# Patient Record
Sex: Male | Born: 1965 | Race: White | Hispanic: No | Marital: Single | Smoking: Current every day smoker
Health system: Southern US, Community
[De-identification: ages and names within clinical notes are randomized; demographics above are authoritative.]

## PROBLEM LIST (undated history)

## (undated) DIAGNOSIS — F17200 Nicotine dependence, unspecified, uncomplicated: Secondary | ICD-10-CM

## (undated) DIAGNOSIS — M259 Joint disorder, unspecified: Secondary | ICD-10-CM

## (undated) HISTORY — PX: KNEE ARTHROSCOPY W/ ACL RECONSTRUCTION: SHX1858

## (undated) HISTORY — PX: KNEE CARTILAGE SURGERY: SHX688

## (undated) HISTORY — PX: ROTATOR CUFF REPAIR: SHX139

## (undated) HISTORY — PX: INCISION AND DRAINAGE PERIRECTAL ABSCESS: SHX1804

---

## 2014-06-29 ENCOUNTER — Encounter (HOSPITAL_COMMUNITY): Payer: Self-pay | Admitting: Emergency Medicine

## 2014-06-29 ENCOUNTER — Emergency Department (HOSPITAL_COMMUNITY)
Admission: EM | Admit: 2014-06-29 | Discharge: 2014-06-29 | Disposition: A | Discharge status: Home / Self Care | Payer: Medicaid Other | Attending: Emergency Medicine | Admitting: Emergency Medicine

## 2014-06-29 ENCOUNTER — Emergency Department (HOSPITAL_COMMUNITY): Payer: Medicaid Other

## 2014-06-29 DIAGNOSIS — R5383 Other fatigue: Secondary | ICD-10-CM

## 2014-06-29 DIAGNOSIS — K612 Anorectal abscess: Secondary | ICD-10-CM | POA: Diagnosis not present

## 2014-06-29 DIAGNOSIS — K6289 Other specified diseases of anus and rectum: Secondary | ICD-10-CM | POA: Diagnosis present

## 2014-06-29 DIAGNOSIS — R000 Tachycardia, unspecified: Secondary | ICD-10-CM | POA: Diagnosis not present

## 2014-06-29 DIAGNOSIS — Z792 Long term (current) use of antibiotics: Secondary | ICD-10-CM | POA: Diagnosis not present

## 2014-06-29 DIAGNOSIS — K611 Rectal abscess: Secondary | ICD-10-CM

## 2014-06-29 DIAGNOSIS — F17200 Nicotine dependence, unspecified, uncomplicated: Secondary | ICD-10-CM | POA: Insufficient documentation

## 2014-06-29 DIAGNOSIS — R5381 Other malaise: Secondary | ICD-10-CM | POA: Diagnosis not present

## 2014-06-29 DIAGNOSIS — K610 Anal abscess: Secondary | ICD-10-CM

## 2014-06-29 DIAGNOSIS — R509 Fever, unspecified: Secondary | ICD-10-CM | POA: Insufficient documentation

## 2014-06-29 LAB — URINALYSIS, ROUTINE W REFLEX MICROSCOPIC
Bilirubin Urine: NEGATIVE
GLUCOSE, UA: NEGATIVE mg/dL
HGB URINE DIPSTICK: NEGATIVE
Ketones, ur: 15 mg/dL — AB
Leukocytes, UA: NEGATIVE
Nitrite: NEGATIVE
PROTEIN: NEGATIVE mg/dL
Specific Gravity, Urine: 1.014 (ref 1.005–1.030)
Urobilinogen, UA: 1 mg/dL (ref 0.0–1.0)
pH: 6 (ref 5.0–8.0)

## 2014-06-29 MED ORDER — HYDROMORPHONE HCL PF 1 MG/ML IJ SOLN
1.0000 mg | Freq: Once | INTRAMUSCULAR | Status: DC
  Filled 2014-06-29: qty 1

## 2014-06-29 MED ORDER — OXYCODONE-ACETAMINOPHEN 5-325 MG PO TABS: 1.0000 | ORAL_TABLET | ORAL | Status: DC | PRN

## 2014-06-29 MED ORDER — SODIUM CHLORIDE 0.9 % IV SOLN: INTRAVENOUS | Status: DC

## 2014-06-29 MED ORDER — AMOXICILLIN-POT CLAVULANATE 875-125 MG PO TABS: 1.0000 | ORAL_TABLET | Freq: Two times a day (BID) | ORAL | Status: AC

## 2014-06-29 MED ORDER — LIDOCAINE HCL 1 % IJ SOLN
INTRAMUSCULAR | Status: AC
  Filled 2014-06-29: qty 20

## 2014-06-29 MED ORDER — LIDOCAINE-EPINEPHRINE (PF) 1 %-1:200000 IJ SOLN
INTRAMUSCULAR | Status: AC
  Filled 2014-06-29: qty 10

## 2014-06-29 MED ORDER — ONDANSETRON HCL 4 MG/2ML IJ SOLN
4.0000 mg | Freq: Once | INTRAMUSCULAR | Status: DC
  Filled 2014-06-29: qty 2

## 2014-06-29 NOTE — Discharge Instructions (Signed)

## 2014-06-29 NOTE — ED Notes (Signed)
Pt reports he is here because of a peri-rectal abcess,  States he has had one before and knows because it feels the same.  Pt reports tried sitz baths and takes percocet but not stool softener .  Sts not impacted and the pain is 5/10 when lying, 8/10 when sitting.  States 10/10 went standing.  Pt calm and affable.

## 2014-06-29 NOTE — ED Notes (Signed)
Forgot to have pt sign for discharge

## 2014-06-29 NOTE — Consult Note (Signed)
Reason for Consult perianal abscess Referring Physician: Dr Vivi Martens MD  Louis Mcconnell is an 48 y.o. male.  HPI:   Patient presents to the emergency room with a five-day history of perianal pain, perianal swelling, and fever today. Patient has history of perirectal abscess drained in 2 years ago. He is trying to medicate this at home with no relief. He has tried topical creams and soaking in warm tub water. He continues to have perianal pain and swelling. Denies any drainage. No change in bowel function. He has had some difficulty passing his urine.  He was able to void with  Me in the room.  History reviewed. No pertinent past medical history.  Past Surgical History  Procedure Laterality Date  . Knee cartilage surgery    . Knee arthroscopy w/ acl reconstruction    . Incision and drainage perirectal abscess      No family history on file.  Social History:  reports that he has been smoking.  He does not have any smokeless tobacco history on file. He reports that he drinks alcohol. He reports that he does not use illicit drugs.  Allergies: No Known Allergies  Medications: I have reviewed the patient's current medications.  Results for orders placed during the hospital encounter of 06/29/14 (from the past 48 hour(s))  URINALYSIS, ROUTINE W REFLEX MICROSCOPIC     Status: Abnormal   Collection Time    06/29/14  7:54 PM      Result Value Ref Range   Color, Urine YELLOW  YELLOW   APPearance CLEAR  CLEAR   Specific Gravity, Urine 1.014  1.005 - 1.030   pH 6.0  5.0 - 8.0   Glucose, UA NEGATIVE  NEGATIVE mg/dL   Hgb urine dipstick NEGATIVE  NEGATIVE   Bilirubin Urine NEGATIVE  NEGATIVE   Ketones, ur 15 (*) NEGATIVE mg/dL   Protein, ur NEGATIVE  NEGATIVE mg/dL   Urobilinogen, UA 1.0  0.0 - 1.0 mg/dL   Nitrite NEGATIVE  NEGATIVE   Leukocytes, UA NEGATIVE  NEGATIVE   Comment: MICROSCOPIC NOT DONE ON URINES WITH NEGATIVE PROTEIN, BLOOD, LEUKOCYTES, NITRITE, OR GLUCOSE <1000 mg/dL.    No  results found.  Review of Systems  Constitutional: Positive for fever, chills and malaise/fatigue.  HENT: Negative.   Eyes: Negative.   Cardiovascular: Negative.   Gastrointestinal: Negative.   Genitourinary: Negative.   Musculoskeletal: Negative.   Skin: Negative.   Neurological: Negative.   Endo/Heme/Allergies: Negative.    Blood pressure 151/87, pulse 113, temperature 100.4 F (38 C), temperature source Oral, resp. rate 17, SpO2 98.00%. Physical Exam  Constitutional: He is oriented to person, place, and time. He appears well-developed and well-nourished.  HENT:  Head: Normocephalic and atraumatic.  Eyes: EOM are normal. Pupils are equal, round, and reactive to light.  Neck: Normal range of motion. Neck supple.  Respiratory: Effort normal.  GI: Soft. Bowel sounds are normal.  Genitourinary: Rectal exam shows mass and tenderness. Rectal exam shows no internal hemorrhoid.     Musculoskeletal: Normal range of motion.  Neurological: He is alert and oriented to person, place, and time.  Skin: Skin is warm and dry.  Psychiatric: He has a normal mood and affect. His behavior is normal. Judgment and thought content normal.    Assessment/Plan: Superficial perianal abscess  Recommend drainage in the emergency room. Discussed the procedure with the patient as well as risk of potential complications. Risk of bleeding, infection, recurrence, injury to neighboring structures, and the need for other  operations. He would like to proceed with bedside drainage of his perianal abscess.  Procedure: Patient placed on  Left side. Perianal region over abscess prepped in sterile fashion. 10 cc of  2% lidocaine used and infiltrated around the 2 cm abscess. Incision made and pus expressed from the area. Packed with quarter-inch iodoform packing. Dry dressing applied. Patient tolerated the procedure well.  Discharge instructions: Patient will be discharged on Augmentin 875 mg by mouth twice a day.  Prescription given for Percocet 1-2 tablets every 4 hours as needed. OK to shower. Return to clinic on Tuesday to urgent office area rechecked. Call with any increasing fever, pain, nausea, vomiting, or other issue of concern.  Maanya Hippert A. 06/29/2014, 9:08 PM

## 2014-06-29 NOTE — ED Provider Notes (Addendum)
CSN: [id number]     Arrival date & time 06/29/14  1846 History   First MD Initiated Contact with Patient 06/29/14 1922     Chief Complaint  Patient presents with  . Rectal Pain     (Consider location/radiation/quality/duration/timing/severity/associated sxs/prior Treatment) HPI Comments: Patient here complaining of rectal pain x4 days. History of perirectal abscess and this is similar. He's also had a fever at home after 102. He has had nausea and body aches as well as some blood in stool. Has noted fullness around his anus. Denies abdominal pain. Has been using sitz baths and suppositories without relief. He is also to use stool softeners. Symptoms worse with defecating in movement and characterized as sharp pain. Nothing makes her symptoms better.   History reviewed. No pertinent past medical history. Past Surgical History  Procedure Laterality Date  . Knee cartilage surgery    . Knee arthroscopy w/ acl reconstruction    . Incision and drainage perirectal abscess     No family history on file. History  Substance Use Topics  . Smoking status: Current Every Day Smoker  . Smokeless tobacco: Not on file  . Alcohol Use: Yes     Comment: socially    Review of Systems  All other systems reviewed and are negative.     Allergies  Review of patient's allergies indicates no known allergies.  Home Medications   Prior to Admission medications   Medication Sig Start Date End Date Taking? Authorizing Provider  doxycycline (VIBRA-TABS) 100 MG tablet Take 100 mg by mouth 2 (two) times daily.   Yes Historical Provider, MD  oxyCODONE-acetaminophen (PERCOCET/ROXICET) 5-325 MG per tablet Take 1 tablet by mouth every 4 (four) hours as needed for moderate pain or severe pain.   Yes Historical Provider, MD  Pseudoeph-Doxylamine-DM-APAP (NYQUIL D COLD/FLU PO) Take 2 capsules by mouth once as needed (sinuses.).   Yes Historical Provider, MD  Tetrahydrozoline HCl (OPTI-CLEAR OP) Apply 1-2 drops  to eye daily as needed (dry eyes).   Yes Historical Provider, MD   BP 151/87  Pulse 113  Temp(Src) 100.4 F (38 C) (Oral)  Resp 17  SpO2 98% Physical Exam  Nursing note and vitals reviewed. Constitutional: He is oriented to person, place, and time. He appears well-developed and well-nourished.  Non-toxic appearance. No distress.  HENT:  Head: Normocephalic and atraumatic.  Eyes: Conjunctivae, EOM and lids are normal. Pupils are equal, round, and reactive to light.  Neck: Normal range of motion. Neck supple. No tracheal deviation present. No mass present.  Cardiovascular: Regular rhythm and normal heart sounds.  Tachycardia present.  Exam reveals no gallop.   No murmur heard. Pulmonary/Chest: Effort normal and breath sounds normal. No stridor. No respiratory distress. He has no decreased breath sounds. He has no wheezes. He has no rhonchi. He has no rales.  Abdominal: Soft. Normal appearance and bowel sounds are normal. He exhibits no distension. There is no tenderness. There is no rebound and no CVA tenderness.  Genitourinary:     Musculoskeletal: Normal range of motion. He exhibits no edema and no tenderness.  Neurological: He is alert and oriented to person, place, and time. He has normal strength. No cranial nerve deficit or sensory deficit. GCS eye subscore is 4. GCS verbal subscore is 5. GCS motor subscore is 6.  Skin: Skin is warm and dry. No abrasion and no rash noted.  Psychiatric: He has a normal mood and affect. His speech is normal and behavior is normal.  ED Course  Procedures (including critical care time) Labs Review Labs Reviewed  URINE CULTURE  CBC WITH DIFFERENTIAL  BASIC METABOLIC PANEL  URINALYSIS, ROUTINE W REFLEX MICROSCOPIC    Imaging Review No results found.   EKG Interpretation None      MDM   Final diagnoses:  None    Spoke with dr Brantley Stage from general surgery, will come to see patient  9:28 PM Pt seen by dr Lorraine Lax and abscess  drained, f/u instructions given    Leota Jacobsen, MD 06/29/14 1953  Leota Jacobsen, MD 06/29/14 2128

## 2014-06-29 NOTE — ED Notes (Signed)
Pt reports perirectal abcess since Tuesday, progressively worsening. Hx of same that required surgery. Sts no BM since Wednesday, has tried suppositories with no relief. Sts he does not feel impacted. +nausea, body aches, feeling "full"

## 2014-07-01 ENCOUNTER — Telehealth (HOSPITAL_BASED_OUTPATIENT_CLINIC_OR_DEPARTMENT_OTHER): Payer: Self-pay | Admitting: Emergency Medicine

## 2014-07-01 LAB — URINE CULTURE
COLONY COUNT: NO GROWTH
Culture: NO GROWTH

## 2014-07-01 NOTE — Progress Notes (Signed)
  CARE MANAGEMENT ED NOTE 07/01/2014  Patient:  Louis Mcconnell,Louis Mcconnell   Account Number:  [id number]  Date Initiated:  07/01/2014  Documentation initiated by:  Jackelyn Poling  Subjective/Objective Assessment:   48 yr old medicaid of [place] covered pt now living in [place] Assessment Detail:   Seen at [facility] ED on 06/29/14 with c/o rectal pain dx Superficial perianal abscess with drainage in [facility] ED by Dr Marlane Mingle  Left Cm a voice message requesting assistance to change Medicaid of [place] to [facility]     Action/Plan:   ED Cm received pt voice message left at [facility] office # CM returned a call Left message for pt to seek assistance at [place] or request a transfer from [place] of county his medicaid was obtained in [place]   Action/Plan Detail:   Anticipated DC Date:  06/29/2014     Status Recommendation to Physician:   Result of Recommendation:    Other ED [place]  Other  Outpatient Services - Pt will follow up    Choice offered to / List presented to:            Status of service:  Completed, signed off  ED Comments:   ED Comments Detail:

## 2014-07-22 ENCOUNTER — Other Ambulatory Visit (INDEPENDENT_AMBULATORY_CARE_PROVIDER_SITE_OTHER): Payer: Self-pay | Admitting: Surgery

## 2016-03-21 ENCOUNTER — Emergency Department (HOSPITAL_BASED_OUTPATIENT_CLINIC_OR_DEPARTMENT_OTHER): Payer: Worker's Compensation

## 2016-03-21 ENCOUNTER — Emergency Department (HOSPITAL_BASED_OUTPATIENT_CLINIC_OR_DEPARTMENT_OTHER)
Admission: EM | Admit: 2016-03-21 | Discharge: 2016-03-21 | Disposition: A | Discharge status: Home / Self Care | Payer: Worker's Compensation | Attending: Emergency Medicine | Admitting: Emergency Medicine

## 2016-03-21 ENCOUNTER — Encounter (HOSPITAL_BASED_OUTPATIENT_CLINIC_OR_DEPARTMENT_OTHER): Payer: Self-pay | Admitting: *Deleted

## 2016-03-21 DIAGNOSIS — Y939 Activity, unspecified: Secondary | ICD-10-CM | POA: Diagnosis not present

## 2016-03-21 DIAGNOSIS — Y999 Unspecified external cause status: Secondary | ICD-10-CM | POA: Insufficient documentation

## 2016-03-21 DIAGNOSIS — S43422A Sprain of left rotator cuff capsule, initial encounter: Secondary | ICD-10-CM | POA: Diagnosis not present

## 2016-03-21 DIAGNOSIS — W01198A Fall on same level from slipping, tripping and stumbling with subsequent striking against other object, initial encounter: Secondary | ICD-10-CM | POA: Insufficient documentation

## 2016-03-21 DIAGNOSIS — M25512 Pain in left shoulder: Secondary | ICD-10-CM | POA: Diagnosis present

## 2016-03-21 DIAGNOSIS — Y929 Unspecified place or not applicable: Secondary | ICD-10-CM | POA: Diagnosis not present

## 2016-03-21 DIAGNOSIS — F17200 Nicotine dependence, unspecified, uncomplicated: Secondary | ICD-10-CM | POA: Diagnosis not present

## 2016-03-21 MED ORDER — NAPROXEN 250 MG PO TABS
500.0000 mg | ORAL_TABLET | Freq: Once | ORAL | Status: DC
  Filled 2016-03-21: qty 2

## 2016-03-21 MED ORDER — OXYCODONE-ACETAMINOPHEN 5-325 MG PO TABS
1.0000 | ORAL_TABLET | Freq: Once | ORAL | Status: AC
  Administered 2016-03-21: 1 via ORAL
  Filled 2016-03-21: qty 1

## 2016-03-21 MED ORDER — IBUPROFEN 800 MG PO TABS
800.0000 mg | ORAL_TABLET | Freq: Once | ORAL | Status: AC
  Administered 2016-03-21: 800 mg via ORAL
  Filled 2016-03-21: qty 1

## 2016-03-21 MED ORDER — NAPROXEN 500 MG PO TABS: ORAL_TABLET | ORAL | Status: DC

## 2016-03-21 NOTE — ED Notes (Signed)
At [place] at work, pt slipped and fell backward with L shoulder reaching back behind him and hitting shoulder against lockers. "felt a pop", c/o pain in L shoulder joint, radiates down to mid humerus, (denies: numbness/ tingling, bleeding, sob, dizziness, or other sx or injuries). No meds PTA. Lives in [place], here for work, needs a local PCP.

## 2016-03-21 NOTE — ED Provider Notes (Signed)
CSN: [id number]     Arrival date & time 03/21/16  0145 History   First MD Initiated Contact with Patient 03/21/16 0301     Chief Complaint  Patient presents with  . Shoulder Injury     (Consider location/radiation/quality/duration/timing/severity/associated sxs/prior Treatment) HPI  This is a 50 year old male who was at work this morning about 12:30. He slipped due to wearing wet shoes and fell, hyperextending his left arm at the shoulder. He felt a pop and has subsequently, gradually, developed moderate to severe pain in his left shoulder on attempted abduction or internal rotation. Range of motion of the left shoulder is now significantly impaired. There is no associated deformity nor numbness or functional deficit distally. He denies other injury. He has not taken anything for this.   History reviewed. No pertinent past medical history. Past Surgical History  Procedure Laterality Date  . Knee cartilage surgery    . Knee arthroscopy w/ acl reconstruction    . Incision and drainage perirectal abscess     History reviewed. No pertinent family history. Social History  Substance Use Topics  . Smoking status: Current Every Day Smoker  . Smokeless tobacco: None  . Alcohol Use: Yes     Comment: socially    Review of Systems  All other systems reviewed and are negative.   Allergies  Lortab  Home Medications   Prior to Admission medications   Medication Sig Start Date End Date Taking? Authorizing Provider  oxyCODONE-acetaminophen (ROXICET) 5-325 MG per tablet Take 1 tablet by mouth every 4 (four) hours as needed. 06/29/14   Erroll Luna, MD  Pseudoeph-Doxylamine-DM-APAP (NYQUIL D COLD/FLU PO) Take 2 capsules by mouth once as needed (sinuses.).    Historical Provider, MD  Tetrahydrozoline HCl (OPTI-CLEAR OP) Apply 1-2 drops to eye daily as needed (dry eyes).    Historical Provider, MD   BP 147/92 mmHg  Pulse 106  Temp(Src) 98.9 F (37.2 C) (Oral)  Resp 16  Ht 5\' 10"  (1.778  m)  Wt 180 lb (81.647 kg)  BMI 25.83 kg/m2  SpO2 98%   Physical Exam  General: Well-developed, well-nourished male in no acute distress; appearance consistent with age of record HENT: normocephalic; atraumatic Eyes: pupils equal, round and reactive to light; extraocular muscles intact Neck: supple; no C-spine tenderness Heart: regular rate and rhythm Lungs: clear to auscultation bilaterally Abdomen: soft; nondistended Extremities: No deformity; full range of motion except left shoulder; pulses normal; left shoulder nontender but pain on attempted passive abduction or internal rotation Neurologic: Awake, alert and oriented; motor function intact in all extremities and symmetric; no facial droop Skin: Warm and dry Psychiatric: Normal mood and affect    ED Course  Procedures (including critical care time)   MDM  Nursing notes and vitals signs, including pulse oximetry, reviewed.  Summary of this visit's results, reviewed by myself:  Imaging Studies: Dg Shoulder Left  03/21/2016  CLINICAL DATA:  Status post fall backward, with left shoulder pain. Felt a pop. Initial encounter. EXAM: LEFT SHOULDER - 2+ VIEW COMPARISON:  None. FINDINGS: There is no evidence of fracture or dislocation. The left humeral head is seated within the glenoid fossa. The acromioclavicular joint is unremarkable in appearance. No significant soft tissue abnormalities are seen. The visualized portions of the left lung are clear. IMPRESSION: No evidence of fracture or dislocation. Electronically Signed   By: Garald Balding M.D.   On: 03/21/2016 02:37   Exam consistent with a left rotator cuff tear.     Jenny Reichmann  Aymar Whitfill, MD 03/21/16 [id number]

## 2016-07-07 ENCOUNTER — Other Ambulatory Visit: Payer: Self-pay | Admitting: Orthopedic Surgery

## 2016-07-14 ENCOUNTER — Encounter (HOSPITAL_BASED_OUTPATIENT_CLINIC_OR_DEPARTMENT_OTHER): Payer: Self-pay | Admitting: *Deleted

## 2016-07-19 ENCOUNTER — Ambulatory Visit (HOSPITAL_BASED_OUTPATIENT_CLINIC_OR_DEPARTMENT_OTHER)
Admission: RE | Admit: 2016-07-19 | Discharge: 2016-07-19 | Disposition: A | Discharge status: Home / Self Care | Payer: Worker's Compensation | Source: Ambulatory Visit | Attending: Orthopedic Surgery | Admitting: Orthopedic Surgery

## 2016-07-19 ENCOUNTER — Ambulatory Visit (HOSPITAL_BASED_OUTPATIENT_CLINIC_OR_DEPARTMENT_OTHER): Payer: Worker's Compensation | Admitting: Anesthesiology

## 2016-07-19 ENCOUNTER — Encounter (HOSPITAL_BASED_OUTPATIENT_CLINIC_OR_DEPARTMENT_OTHER)
Admission: RE | Disposition: A | Discharge status: Home / Self Care | Payer: Self-pay | Source: Ambulatory Visit | Attending: Orthopedic Surgery

## 2016-07-19 ENCOUNTER — Encounter (HOSPITAL_BASED_OUTPATIENT_CLINIC_OR_DEPARTMENT_OTHER): Payer: Self-pay | Admitting: *Deleted

## 2016-07-19 DIAGNOSIS — Z885 Allergy status to narcotic agent status: Secondary | ICD-10-CM | POA: Insufficient documentation

## 2016-07-19 DIAGNOSIS — F17200 Nicotine dependence, unspecified, uncomplicated: Secondary | ICD-10-CM | POA: Diagnosis not present

## 2016-07-19 DIAGNOSIS — Z79899 Other long term (current) drug therapy: Secondary | ICD-10-CM | POA: Diagnosis not present

## 2016-07-19 DIAGNOSIS — Z888 Allergy status to other drugs, medicaments and biological substances status: Secondary | ICD-10-CM | POA: Diagnosis not present

## 2016-07-19 DIAGNOSIS — M719 Bursopathy, unspecified: Secondary | ICD-10-CM | POA: Insufficient documentation

## 2016-07-19 DIAGNOSIS — M75112 Incomplete rotator cuff tear or rupture of left shoulder, not specified as traumatic: Secondary | ICD-10-CM | POA: Insufficient documentation

## 2016-07-19 HISTORY — DX: Joint disorder, unspecified: M25.9

## 2016-07-19 HISTORY — PX: SHOULDER ARTHROSCOPY WITH BANKART REPAIR: SHX5673

## 2016-07-19 HISTORY — DX: Nicotine dependence, unspecified, uncomplicated: F17.200

## 2016-07-19 SURGERY — SHOULDER ARTHROSCOPY WITH BANKART REPAIR
Anesthesia: General | Site: Shoulder | Laterality: Left

## 2016-07-19 MED ORDER — GLYCOPYRROLATE 0.2 MG/ML IJ SOLN: 0.2000 mg | Freq: Once | INTRAMUSCULAR | Status: DC | PRN

## 2016-07-19 MED ORDER — CEFAZOLIN SODIUM-DEXTROSE 2-4 GM/100ML-% IV SOLN
INTRAVENOUS | Status: AC
  Filled 2016-07-19: qty 100

## 2016-07-19 MED ORDER — BUPIVACAINE-EPINEPHRINE (PF) 0.5% -1:200000 IJ SOLN
INTRAMUSCULAR | Status: DC | PRN
  Administered 2016-07-19: 30 mL via PERINEURAL

## 2016-07-19 MED ORDER — SODIUM CHLORIDE 0.9 % IR SOLN
Status: DC | PRN
  Administered 2016-07-19: 6000 mL

## 2016-07-19 MED ORDER — DEXAMETHASONE SODIUM PHOSPHATE 4 MG/ML IJ SOLN
INTRAMUSCULAR | Status: DC | PRN
  Administered 2016-07-19: 10 mg via INTRAVENOUS

## 2016-07-19 MED ORDER — PROPOFOL 500 MG/50ML IV EMUL
INTRAVENOUS | Status: AC
  Filled 2016-07-19: qty 50

## 2016-07-19 MED ORDER — ONDANSETRON HCL 4 MG/2ML IJ SOLN
INTRAMUSCULAR | Status: DC | PRN
  Administered 2016-07-19: 4 mg via INTRAVENOUS

## 2016-07-19 MED ORDER — PROPOFOL 10 MG/ML IV BOLUS
INTRAVENOUS | Status: DC | PRN
  Administered 2016-07-19: 200 mg via INTRAVENOUS

## 2016-07-19 MED ORDER — MIDAZOLAM HCL 2 MG/2ML IJ SOLN
1.0000 mg | INTRAMUSCULAR | Status: DC | PRN
  Administered 2016-07-19: 2 mg via INTRAVENOUS

## 2016-07-19 MED ORDER — SUCCINYLCHOLINE CHLORIDE 20 MG/ML IJ SOLN
INTRAMUSCULAR | Status: DC | PRN
  Administered 2016-07-19: 120 mg via INTRAVENOUS

## 2016-07-19 MED ORDER — CEFAZOLIN SODIUM-DEXTROSE 2-4 GM/100ML-% IV SOLN
2.0000 g | INTRAVENOUS | Status: AC
  Administered 2016-07-19: 2 g via INTRAVENOUS

## 2016-07-19 MED ORDER — LACTATED RINGERS IV SOLN
INTRAVENOUS | Status: DC
  Administered 2016-07-19 (×2): via INTRAVENOUS

## 2016-07-19 MED ORDER — FENTANYL CITRATE (PF) 100 MCG/2ML IJ SOLN
INTRAMUSCULAR | Status: AC
  Filled 2016-07-19: qty 2

## 2016-07-19 MED ORDER — OXYCODONE-ACETAMINOPHEN 5-325 MG PO TABS: 1.0000 | ORAL_TABLET | ORAL | 0 refills | Status: AC | PRN

## 2016-07-19 MED ORDER — HYDROMORPHONE HCL 1 MG/ML IJ SOLN: 0.2500 mg | INTRAMUSCULAR | Status: DC | PRN

## 2016-07-19 MED ORDER — DEXAMETHASONE SODIUM PHOSPHATE 10 MG/ML IJ SOLN
INTRAMUSCULAR | Status: AC
  Filled 2016-07-19: qty 1

## 2016-07-19 MED ORDER — FENTANYL CITRATE (PF) 100 MCG/2ML IJ SOLN
50.0000 ug | INTRAMUSCULAR | Status: DC | PRN
  Administered 2016-07-19: 100 ug via INTRAVENOUS

## 2016-07-19 MED ORDER — MIDAZOLAM HCL 2 MG/2ML IJ SOLN
INTRAMUSCULAR | Status: AC
  Filled 2016-07-19: qty 2

## 2016-07-19 MED ORDER — ONDANSETRON HCL 4 MG/2ML IJ SOLN
INTRAMUSCULAR | Status: AC
  Filled 2016-07-19: qty 2

## 2016-07-19 MED ORDER — PROMETHAZINE HCL 25 MG/ML IJ SOLN: 6.2500 mg | INTRAMUSCULAR | Status: DC | PRN

## 2016-07-19 MED ORDER — POVIDONE-IODINE 7.5 % EX SOLN: Freq: Once | CUTANEOUS | Status: DC

## 2016-07-19 MED ORDER — SUCCINYLCHOLINE CHLORIDE 200 MG/10ML IV SOSY
PREFILLED_SYRINGE | INTRAVENOUS | Status: AC
  Filled 2016-07-19: qty 10

## 2016-07-19 MED ORDER — SCOPOLAMINE 1 MG/3DAYS TD PT72: 1.0000 | MEDICATED_PATCH | Freq: Once | TRANSDERMAL | Status: DC | PRN

## 2016-07-19 MED ORDER — DOCUSATE SODIUM 100 MG PO CAPS: 100.0000 mg | ORAL_CAPSULE | Freq: Three times a day (TID) | ORAL | 0 refills | Status: AC | PRN

## 2016-07-19 MED ORDER — EPHEDRINE SULFATE 50 MG/ML IJ SOLN
INTRAMUSCULAR | Status: DC | PRN
  Administered 2016-07-19: 5 mg via INTRAVENOUS
  Administered 2016-07-19: 10 mg via INTRAVENOUS
  Administered 2016-07-19 (×2): 5 mg via INTRAVENOUS

## 2016-07-19 MED ORDER — LIDOCAINE HCL 4 % EX SOLN
CUTANEOUS | Status: DC | PRN
  Administered 2016-07-19: 3 mL via TOPICAL

## 2016-07-19 SURGICAL SUPPLY — 91 items
ANCHOR SUT BIOCOMP LK 2.9X12.5 (Anchor) ×9 IMPLANT
BENZOIN TINCTURE PRP APPL 2/3 (GAUZE/BANDAGES/DRESSINGS) IMPLANT
BLADE CLIPPER SURG (BLADE) IMPLANT
BLADE SURG 15 STRL LF DISP TIS (BLADE) IMPLANT
BLADE SURG 15 STRL SS (BLADE)
BUR 3.5 LG SPHERICAL (BURR) IMPLANT
BUR OVAL 4.0 (BURR) ×3 IMPLANT
BURR 3.5 LG SPHERICAL (BURR)
BURR 3.5MM LG SPHERICAL (BURR)
CANNULA 5.75X71 LONG (CANNULA) ×3 IMPLANT
CANNULA TWIST IN 8.25X7CM (CANNULA) ×3 IMPLANT
CHLORAPREP W/TINT 26ML (MISCELLANEOUS) ×3 IMPLANT
CLOSURE WOUND 1/2 X4 (GAUZE/BANDAGES/DRESSINGS)
DECANTER SPIKE VIAL GLASS SM (MISCELLANEOUS) IMPLANT
DRAPE IMP U-DRAPE 54X76 (DRAPES) ×3 IMPLANT
DRAPE INCISE IOBAN 66X45 STRL (DRAPES) ×3 IMPLANT
DRAPE STERI 35X30 U-POUCH (DRAPES) ×3 IMPLANT
DRAPE SURG 17X23 STRL (DRAPES) ×3 IMPLANT
DRAPE U-SHAPE 47X51 STRL (DRAPES) ×3 IMPLANT
DRAPE U-SHAPE 76X120 STRL (DRAPES) ×6 IMPLANT
DRSG PAD ABDOMINAL 8X10 ST (GAUZE/BANDAGES/DRESSINGS) ×3 IMPLANT
ELECT REM PT RETURN 9FT ADLT (ELECTROSURGICAL) ×3
ELECTRODE REM PT RTRN 9FT ADLT (ELECTROSURGICAL) ×1 IMPLANT
GAUZE SPONGE 4X4 12PLY STRL (GAUZE/BANDAGES/DRESSINGS) ×3 IMPLANT
GAUZE SPONGE 4X4 16PLY XRAY LF (GAUZE/BANDAGES/DRESSINGS) IMPLANT
GAUZE XEROFORM 1X8 LF (GAUZE/BANDAGES/DRESSINGS) ×3 IMPLANT
GLOVE BIO SURGEON STRL SZ7 (GLOVE) ×3 IMPLANT
GLOVE BIO SURGEON STRL SZ7.5 (GLOVE) ×3 IMPLANT
GLOVE BIOGEL PI IND STRL 7.0 (GLOVE) ×3 IMPLANT
GLOVE BIOGEL PI IND STRL 8 (GLOVE) ×1 IMPLANT
GLOVE BIOGEL PI INDICATOR 7.0 (GLOVE) ×6
GLOVE BIOGEL PI INDICATOR 8 (GLOVE) ×2
GLOVE ECLIPSE 6.5 STRL STRAW (GLOVE) ×6 IMPLANT
GOWN STRL REUS W/ TWL LRG LVL3 (GOWN DISPOSABLE) ×3 IMPLANT
GOWN STRL REUS W/ TWL XL LVL3 (GOWN DISPOSABLE) ×1 IMPLANT
GOWN STRL REUS W/TWL LRG LVL3 (GOWN DISPOSABLE) ×9 IMPLANT
GOWN STRL REUS W/TWL XL LVL3 (GOWN DISPOSABLE) ×2
IV NS IRRIG 3000ML ARTHROMATIC (IV SOLUTION) ×6 IMPLANT
KIT PUSHLOCK 2.9 HIP (KITS) ×3 IMPLANT
LASSO 90 CVE QUICKPAS (DISPOSABLE) ×3 IMPLANT
LIQUID BAND (GAUZE/BANDAGES/DRESSINGS) IMPLANT
MANIFOLD NEPTUNE II (INSTRUMENTS) ×3 IMPLANT
NDL SUT 6 .5 CRC .975X.05 MAYO (NEEDLE) IMPLANT
NEEDLE 1/2 CIR CATGUT .05X1.09 (NEEDLE) IMPLANT
NEEDLE MAYO TAPER (NEEDLE)
NS IRRIG 1000ML POUR BTL (IV SOLUTION) IMPLANT
PACK ARTHROSCOPY DSU (CUSTOM PROCEDURE TRAY) ×3 IMPLANT
PACK BASIN DAY SURGERY FS (CUSTOM PROCEDURE TRAY) ×3 IMPLANT
PENCIL BUTTON HOLSTER BLD 10FT (ELECTRODE) IMPLANT
RESECTOR FULL RADIUS 4.2MM (BLADE) ×3 IMPLANT
SHEET MEDIUM DRAPE 40X70 STRL (DRAPES) IMPLANT
SLEEVE SCD COMPRESS KNEE MED (MISCELLANEOUS) ×3 IMPLANT
SLING ARM FOAM STRAP LRG (SOFTGOODS) IMPLANT
SLING ARM IMMOBILIZER LRG (SOFTGOODS) ×3 IMPLANT
SLING ARM IMMOBILIZER MED (SOFTGOODS) IMPLANT
SLING ARM MED ADULT FOAM STRAP (SOFTGOODS) IMPLANT
SLING ARM XL FOAM STRAP (SOFTGOODS) IMPLANT
SPONGE LAP 4X18 X RAY DECT (DISPOSABLE) IMPLANT
STRIP CLOSURE SKIN 1/2X4 (GAUZE/BANDAGES/DRESSINGS) IMPLANT
SUCTION FRAZIER HANDLE 10FR (MISCELLANEOUS)
SUCTION TUBE FRAZIER 10FR DISP (MISCELLANEOUS) IMPLANT
SUPPORT WRAP ARM LG (MISCELLANEOUS) ×3 IMPLANT
SUT BONE WAX W31G (SUTURE) IMPLANT
SUT ETHIBOND 2 OS 4 DA (SUTURE) IMPLANT
SUT ETHILON 3 0 PS 1 (SUTURE) ×3 IMPLANT
SUT ETHILON 4 0 PS 2 18 (SUTURE) IMPLANT
SUT FIBERWIRE #2 38 T-5 BLUE (SUTURE)
SUT FIBERWIRE 2-0 18 17.9 3/8 (SUTURE)
SUT MNCRL AB 3-0 PS2 18 (SUTURE) IMPLANT
SUT MNCRL AB 4-0 PS2 18 (SUTURE) IMPLANT
SUT PDS AB 0 CT 36 (SUTURE) IMPLANT
SUT PROLENE 3 0 PS 2 (SUTURE) IMPLANT
SUT TIGER TAPE 7 IN WHITE (SUTURE) ×3 IMPLANT
SUT VIC AB 0 CT1 27 (SUTURE)
SUT VIC AB 0 CT1 27XBRD ANBCTR (SUTURE) IMPLANT
SUT VIC AB 2-0 SH 27 (SUTURE)
SUT VIC AB 2-0 SH 27XBRD (SUTURE) IMPLANT
SUTURE FIBERWR #2 38 T-5 BLUE (SUTURE) IMPLANT
SUTURE FIBERWR 2-0 18 17.9 3/8 (SUTURE) IMPLANT
SYR BULB 3OZ (MISCELLANEOUS) IMPLANT
TAPE FIBER 2MM 7IN #2 BLUE (SUTURE) IMPLANT
TAPE LABRALWHITE 1.5X36 (TAPE) ×6 IMPLANT
TAPE SUT LABRALTAP WHT/BLK (SUTURE) IMPLANT
TOWEL OR 17X24 6PK STRL BLUE (TOWEL DISPOSABLE) ×3 IMPLANT
TOWEL OR NON WOVEN STRL DISP B (DISPOSABLE) IMPLANT
TUBE CONNECTING 20'X1/4 (TUBING) ×1
TUBE CONNECTING 20X1/4 (TUBING) ×2 IMPLANT
TUBING ARTHROSCOPY IRRIG 16FT (MISCELLANEOUS) ×3 IMPLANT
WAND STAR VAC 90 (SURGICAL WAND) IMPLANT
WATER STERILE IRR 1000ML POUR (IV SOLUTION) ×3 IMPLANT
YANKAUER SUCT BULB TIP NO VENT (SUCTIONS) IMPLANT

## 2016-07-19 NOTE — Op Note (Signed)
Procedure(s):  Procedure Note  Louis Mcconnell male 50 y.o. 07/19/2016  Procedure(s) and Anesthesia Type:   #1 LEFT SHOULDER ARTHROSCOPY WITH POSTERIOR BANKART REPAIR - General #2 left shoulder arthroscopic debridement of partial-thickness rotator cuff tear and extensive bursitis   Surgeon(s) and Role:    * Tania Ade, MD - Primary     Surgeon: Nita Sells   Assistants: Jeanmarie Hubert PA-C (Danielle was present and scrubbed throughout the procedure and was essential in positioning, assisting with the camera and instrumentation,, and closure)  Anesthesia: General endotracheal anesthesia with preoperative interscalene block given by the attending anesthesiologist      Procedure Detail  Estimated Blood Loss: Min         Drains: none  Blood Given: none         Specimens: none        Complications:  * No complications entered in OR log *         Disposition: PACU - hemodynamically stable.         Condition: stable    Procedure:   INDICATIONS FOR SURGERY: The patient is 50 y.o. male who had an injury at work with posterior shoulder subluxation/dislocation with resultant posterior labral tear. He has gone on to have continued posterior pain and dysfunction despite adequate healing and therapy time. Indicated for surgical treatment to try and decrease pain and restore function.  OPERATIVE FINDINGS: Examination under anesthesia: No significant stiffness. 2+ posterior instability.   DESCRIPTION OF PROCEDURE: The patient was identified in preoperative  holding area where I personally marked the operative site after  verifying site, side, and procedure with the patient. An interscalene block was given by the attending anesthesiologist the holding area.  The patient was taken back to the operating room where general anesthesia was induced without complication and was placed in the beach-chair position with the back  elevated about 60 degrees and all  extremities and head and neck carefully padded and  positioned.   The left upper extremity was then prepped and  draped in a standard sterile fashion. The appropriate time-out  procedure was carried out. The patient did receive IV antibiotics  within 30 minutes of incision.   A small posterior portal incision was made and the arthroscope was introduced into the joint. An anterior portal was then established above the subscapularis using needle localization. Small cannula was placed anteriorly. Diagnostic arthroscopy was then carried out.  He was noted to have some partial degenerative fraying and tearing of the superior labrum extending down the anterior labrum but no detachment. This was debrided with a shaver back to healthy bleeding labrum. Biceps tendon was pulled in the joint noted to be intact. Subscapularis was intact. Glenohumeral joint surfaces were intact with the exception of a small anterior Hill-Sachs defect which did not involve a large portion of the articular surface and did not cause any detachment of the subscapularis. Supraspinatus was noted to have some partial articular sided tearing involving about 30% of the articular side. This is a bit more than expected based on preoperative imaging. This was debrided back to healthy bleeding tendon and there was no full-thickness penetration. The camera was then moved to high lateral rotator interval portal position and a large cannula was placed posteriorly. The posterior labrum was probed and noted to be torn and medially retracted. This was freed up with arthroscopic elevator from about the 10:00 to 6:00 position. The bony glenoid was debrided down to bleeding bone to promote healing here and  then the labral repair was carried out by placing labral tapes starting inferiorly advancing the labrum both laterally and superiorly back to its anatomic position, and 2.9 bio composite push lock anchors. A total of 3 anchors and 3 tapes were used to  anatomically repair of the posterior labrum. This was viewed from the antrum portals well and felt to be anatomic.  The arthroscope was then introduced into the subacromial space a standard lateral portal was established with needle localization. The shaver was used through the lateral portal to perform extensive bursectomy. The bursal side of the rotator cuff was carefully examined and probed. There was no full-thickness tear noted. There is no significantly thin areas by probing.  The arthroscopic equipment was removed from the joint and the portals were closed with 3-0 nylon in an interrupted fashion. Sterile dressings were then applied including Xeroform 4 x 4's ABDs and tape. The patient was then allowed to awaken from general anesthesia, placed in a sling, transferred to the stretcher and taken to the recovery room in stable condition.   POSTOPERATIVE PLAN: The patient will be discharged home today and will followup in one week for suture removal and wound check.  He will follow the standard labral protocol.

## 2016-07-19 NOTE — Discharge Instructions (Signed)
Discharge Instructions after Arthroscopic Shoulder Repair   A sling has been provided for you. Remain in your sling at all times. This includes sleeping in your sling.  Use ice on the shoulder intermittently over the first 48 hours after surgery.  Pain medicine has been prescribed for you.  Use your medicine liberally over the first 48 hours, and then you can begin to taper your use. You may take Extra Strength Tylenol or Tylenol only in place of the pain pills. DO NOT take ANY nonsteroidal anti-inflammatory pain medications: Advil, Motrin, Ibuprofen, Aleve, Naproxen, or Narprosyn.  You may remove your dressing after two days. If the incision sites are still moist, place a Band-Aid over the moist site(s). Change Band-Aids daily until dry.  You may shower 5 days after surgery. The incisions CANNOT get wet prior to 5 days. Simply allow the water to wash over the site and then pat dry. Do not rub the incisions. Make sure your axilla (armpit) is completely dry after showering.  Take one aspirin a day for 2 weeks after surgery, unless you have an aspirin sensitivity/ allergy or asthma.   Please call [phone] during normal business hours or [phone] after hours for any problems. Including the following:  - excessive redness of the incisions - drainage for more than 4 days - fever of more than 101.5 F  *Please note that pain medications will not be refilled after hours or on weekends.   Post Anesthesia Home Care Instructions  Activity: Get plenty of rest for the remainder of the day. A responsible adult should stay with you for 24 hours following the procedure.  For the next 24 hours, DO NOT: -Drive a car -Paediatric nurse -Drink alcoholic beverages -Take any medication unless instructed by your physician -Make any legal decisions or sign important papers.  Meals: Start with liquid foods such as gelatin or soup. Progress to regular foods as tolerated. Avoid greasy, spicy, heavy  foods. If nausea and/or vomiting occur, drink only clear liquids until the nausea and/or vomiting subsides. Call your physician if vomiting continues.  Special Instructions/Symptoms: Your throat may feel dry or sore from the anesthesia or the breathing tube placed in your throat during surgery. If this causes discomfort, gargle with warm salt water. The discomfort should disappear within 24 hours.  If you had a scopolamine patch placed behind your ear for the management of post- operative nausea and/or vomiting:  1. The medication in the patch is effective for 72 hours, after which it should be removed.  Wrap patch in a tissue and discard in the trash. Wash hands thoroughly with soap and water. 2. You may remove the patch earlier than 72 hours if you experience unpleasant side effects which may include dry mouth, dizziness or visual disturbances. 3. Avoid touching the patch. Wash your hands with soap and water after contact with the patch.   Call your surgeon if you experience:   1.  Fever over 101.0. 2.  Inability to urinate. 3.  Nausea and/or vomiting. 4.  Extreme swelling or bruising at the surgical site. 5.  Continued bleeding from the incision. 6.  Increased pain, redness or drainage from the incision. 7.  Problems related to your pain medication. 8.  Any problems and/or concerns  Regional Anesthesia Blocks  1. Numbness or the inability to move the "blocked" extremity may last from 3-48 hours after placement. The length of time depends on the medication injected and your individual response to the medication. If the numbness is  not going away after 48 hours, call your surgeon.  2. The extremity that is blocked will need to be protected until the numbness is gone and the  Strength has returned. Because you cannot feel it, you will need to take extra care to avoid injury. Because it may be weak, you may have difficulty moving it or using it. You may not know what position it is in  without looking at it while the block is in effect.  3. For blocks in the legs and feet, returning to weight bearing and walking needs to be done carefully. You will need to wait until the numbness is entirely gone and the strength has returned. You should be able to move your leg and foot normally before you try and bear weight or walk. You will need someone to be with you when you first try to ensure you do not fall and possibly risk injury.  4. Bruising and tenderness at the needle site are common side effects and will resolve in a few days.  5. Persistent numbness or new problems with movement should be communicated to the surgeon or the [facility] [phone] [place] [phone]).

## 2016-07-19 NOTE — Progress Notes (Signed)
Assisted Dr. Singer with left, ultrasound guided, interscalene  block. Side rails up, monitors on throughout procedure. See vital signs in flow sheet. Tolerated Procedure well.  

## 2016-07-19 NOTE — Anesthesia Postprocedure Evaluation (Signed)
Anesthesia Post Note  Patient: Louis Mcconnell  Procedure(s) Performed: Procedure(s) (LRB): LEFT SHOULDER ARTHROSCOPY WITH BANKART REPAIR (Left)  Patient location during evaluation: PACU Anesthesia Type: General Level of consciousness: sedated Pain management: pain level controlled Vital Signs Assessment: post-procedure vital signs reviewed and stable Respiratory status: spontaneous breathing and respiratory function stable Cardiovascular status: stable Anesthetic complications: no    Last Vitals:  Vitals:   07/19/16 1315 07/19/16 1320  BP: 135/78   Pulse: 95 85  Resp: 16 14  Temp:      Last Pain:  Vitals:   07/19/16 1350  TempSrc:   PainSc: 0-No pain                 [name] DANIEL

## 2016-07-19 NOTE — Transfer of Care (Signed)
Immediate Anesthesia Transfer of Care Note  Patient: Louis Mcconnell  Procedure(s) Performed: Procedure(s) with comments: LEFT SHOULDER ARTHROSCOPY WITH BANKART REPAIR (Left) - Left shoulder arthroscopy bankart repair  Patient Location: PACU  Anesthesia Type:GA combined with regional for post-op pain  Level of Consciousness: sedated  Airway & Oxygen Therapy: Patient Spontanous Breathing and Patient connected to face mask oxygen  Post-op Assessment: Report given to RN and Post -op Vital signs reviewed and stable  Post vital signs: Reviewed and stable  Last Vitals:  Vitals:   07/19/16 1120 07/19/16 1125  BP: 131/84 115/67  Pulse: 67 67  Resp: 16 16  Temp:      Last Pain:  Vitals:   07/19/16 1007  TempSrc: Oral  PainSc: 2       Patients Stated Pain Goal: 2 ([id number])  Complications: No apparent anesthesia complications

## 2016-07-19 NOTE — H&P (Signed)
Louis Mcconnell is an 50 y.o. male.   Chief Complaint: L shoulder pain. HPI:  L shoulder pain and instability after injury at work with posterior dislocation.  Past Medical History:  Diagnosis Date  . Shoulder disorder   . Smoker     Past Surgical History:  Procedure Laterality Date  . INCISION AND DRAINAGE PERIRECTAL ABSCESS    . KNEE ARTHROSCOPY W/ ACL RECONSTRUCTION    . KNEE CARTILAGE SURGERY    . ROTATOR CUFF REPAIR Right     History reviewed. No pertinent family history. Social History:  reports that he has been smoking.  He has been smoking about 0.50 packs per day. He has never used smokeless tobacco. He reports that he drinks alcohol. He reports that he does not use drugs.  Allergies:  Allergies  Allergen Reactions  . Lortab [Hydrocodone-Acetaminophen] Itching  . Percocet [Oxycodone-Acetaminophen] Itching    Makes nose itch    Medications Prior to Admission  Medication Sig Dispense Refill  . Cholecalciferol (VITAMIN D3) 2000 units TABS Take by mouth.    . meloxicam (MOBIC) 7.5 MG tablet Take 7.5 mg by mouth daily.    . niacin 100 MG tablet Take 100 mg by mouth at bedtime.      No results found for this or any previous visit (from the past 48 hour(s)). No results found.  Review of Systems  All other systems reviewed and are negative.   Blood pressure (!) 139/95, temperature 98.2 F (36.8 C), temperature source Oral, resp. rate 18, height 5\' 10"  (1.778 m), weight 82.1 kg (181 lb), SpO2 97 %. Physical Exam  Constitutional: He is oriented to person, place, and time. He appears well-developed and well-nourished.  HENT:  Head: Atraumatic.  Eyes: EOM are normal.  Cardiovascular: Intact distal pulses.   Respiratory: Effort normal.  Musculoskeletal:  L shoulder pain and apprehension with jerk test.  Neurological: He is alert and oriented to person, place, and time.  Skin: Skin is warm and dry.  Psychiatric: He has a normal mood and affect.      Assessment/Plan L shoulder Posterior labral tear Plan Arthroscopic labral repair Risks / benefits of surgery discussed Consent on chart  NPO for OR Preop antibiotics   Nita Sells, MD 07/19/2016, 11:17 AM

## 2016-07-19 NOTE — Anesthesia Preprocedure Evaluation (Signed)
Anesthesia Evaluation  Patient identified by MRN, date of birth, ID band Patient awake    Reviewed: Allergy & Precautions, NPO status , Patient's Chart, lab work & pertinent test results  Airway Mallampati: II  TM Distance: >3 FB Neck ROM: Full    Dental no notable dental hx. (+) Dental Advisory Given   Pulmonary Current Smoker,    Pulmonary exam normal        Cardiovascular negative cardio ROS Normal cardiovascular exam     Neuro/Psych negative neurological ROS  negative psych ROS   GI/Hepatic negative GI ROS, Neg liver ROS,   Endo/Other  negative endocrine ROS  Renal/GU negative Renal ROS  negative genitourinary   Musculoskeletal   Abdominal   Peds negative pediatric ROS (+)  Hematology negative hematology ROS (+)   Anesthesia Other Findings   Reproductive/Obstetrics negative OB ROS                             Anesthesia Physical Anesthesia Plan  ASA: II  Anesthesia Plan: General   Post-op Pain Management: GA combined w/ Regional for post-op pain   Induction: Intravenous  Airway Management Planned: Oral ETT  Additional Equipment:   Intra-op Plan:   Post-operative Plan: Extubation in OR  Informed Consent: I have reviewed the patients History and Physical, chart, labs and discussed the procedure including the risks, benefits and alternatives for the proposed anesthesia with the patient or authorized representative who has indicated his/her understanding and acceptance.   Dental advisory given  Plan Discussed with: CRNA and Anesthesiologist  Anesthesia Plan Comments:         Anesthesia Quick Evaluation

## 2016-07-19 NOTE — Anesthesia Procedure Notes (Signed)
Procedure Name: Intubation Date/Time: 07/19/2016 11:40 AM Performed by: Maryella Shivers Pre-anesthesia Checklist: Patient identified, Emergency Drugs available, Suction available and Patient being monitored Patient Re-evaluated:Patient Re-evaluated prior to inductionOxygen Delivery Method: Circle system utilized Preoxygenation: Pre-oxygenation with 100% oxygen Intubation Type: IV induction Ventilation: Mask ventilation without difficulty Laryngoscope Size: Mac and 3 Grade View: Grade I Tube type: Oral Tube size: 8.0 mm Number of attempts: 1 Airway Equipment and Method: Stylet and Oral airway Placement Confirmation: ETT inserted through vocal cords under direct vision,  positive ETCO2 and breath sounds checked- equal and bilateral Secured at: 22 cm Tube secured with: Tape Dental Injury: Teeth and Oropharynx as per pre-operative assessment

## 2016-07-19 NOTE — Anesthesia Procedure Notes (Signed)
Anesthesia Regional Block:  Interscalene brachial plexus block  Pre-Anesthetic Checklist: ,, timeout performed, Correct Patient, Correct Site, Correct Laterality, Correct Procedure, Correct Position, site marked, Risks and benefits discussed,  Surgical consent,  Pre-op evaluation,  At surgeon's request and post-op pain management  Laterality: Left  Prep: chloraprep       Needles:  Injection technique: Single-shot  Needle Type: Echogenic Stimulator Needle     Needle Length: 5cm 5 cm Needle Gauge: 22 and 22 G    Additional Needles:  Procedures: ultrasound guided (picture in chart) Interscalene brachial plexus block  Nerve Stimulator or Paresthesia:  Response: 0 mA,   Additional Responses:   Narrative:  Start time: 07/19/2016 11:06 AM End time: 07/19/2016 11:16 AM Injection made incrementally with aspirations every 5 mL.  Performed by: Personally  Anesthesiologist: Duane Boston  Additional Notes: Functioning IV was confirmed and monitors applied.  A 42mm 22ga echogenic arrow stimulator was used. Sterile prep and drape,hand hygiene and sterile gloves were used.Ultrasound guidance: relevant anatomy identified, needle position confirmed, local anesthetic spread visualized around nerve(s)., vascular puncture avoided.  Image printed for medical record.  Negative aspiration and negative test dose prior to incremental administration of local anesthetic. The patient tolerated the procedure well.

## 2016-07-20 ENCOUNTER — Encounter (HOSPITAL_BASED_OUTPATIENT_CLINIC_OR_DEPARTMENT_OTHER): Payer: Self-pay | Admitting: Orthopedic Surgery

## 2017-09-30 IMAGING — CR DG SHOULDER 2+V*L*
3 series · 3 of 3 positions shown · non-contrast
Comparison: None.

CLINICAL DATA: Status post fall backward, with left shoulder pain.
Felt a pop. Initial encounter.

EXAM:
LEFT SHOULDER - 2+ VIEW

[w shoulder grashey left]
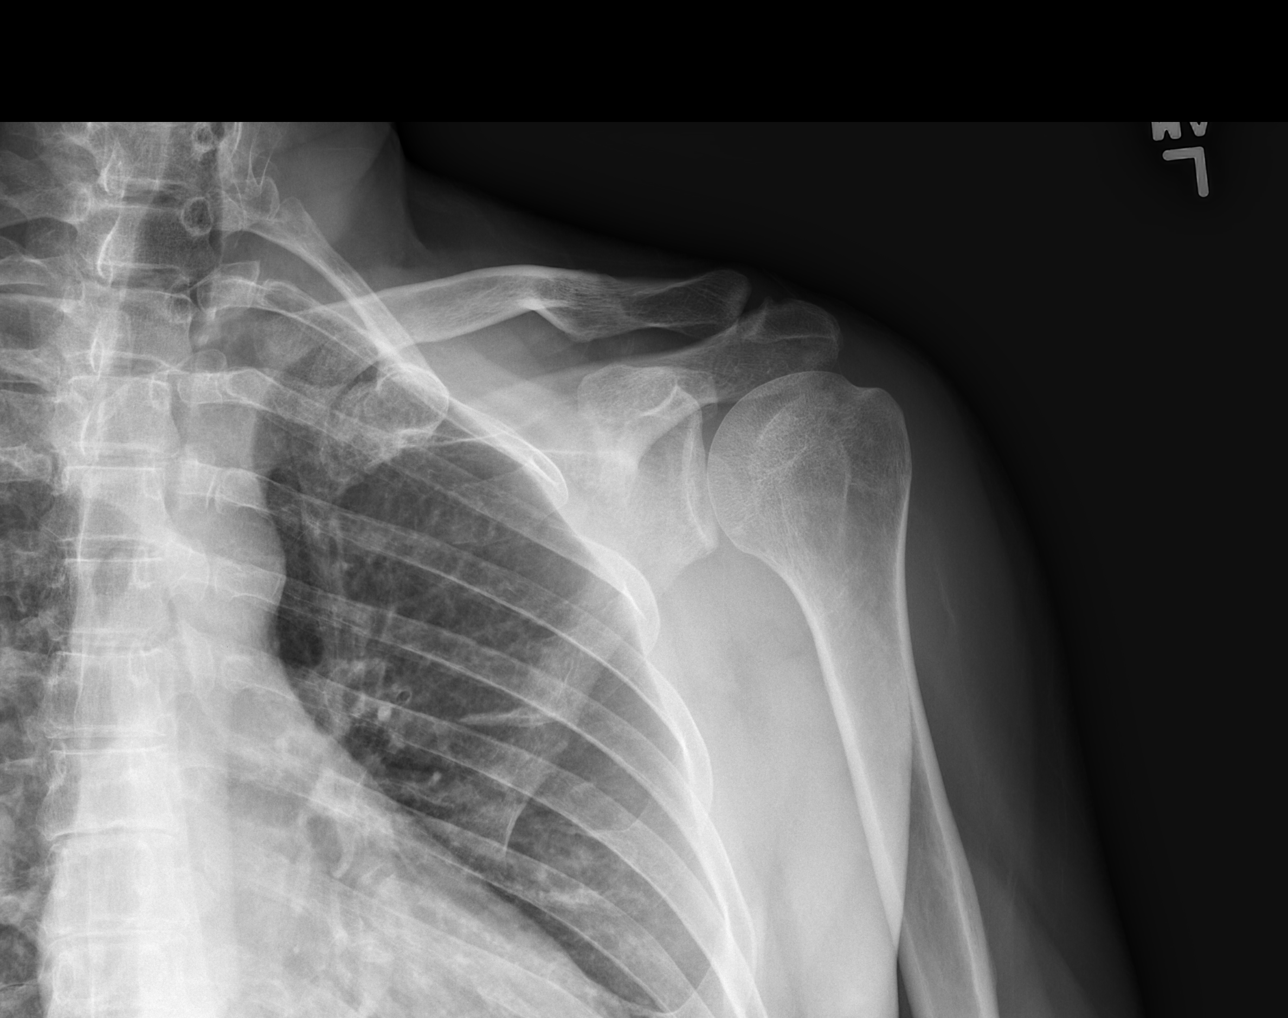

[w shoulder y view left]
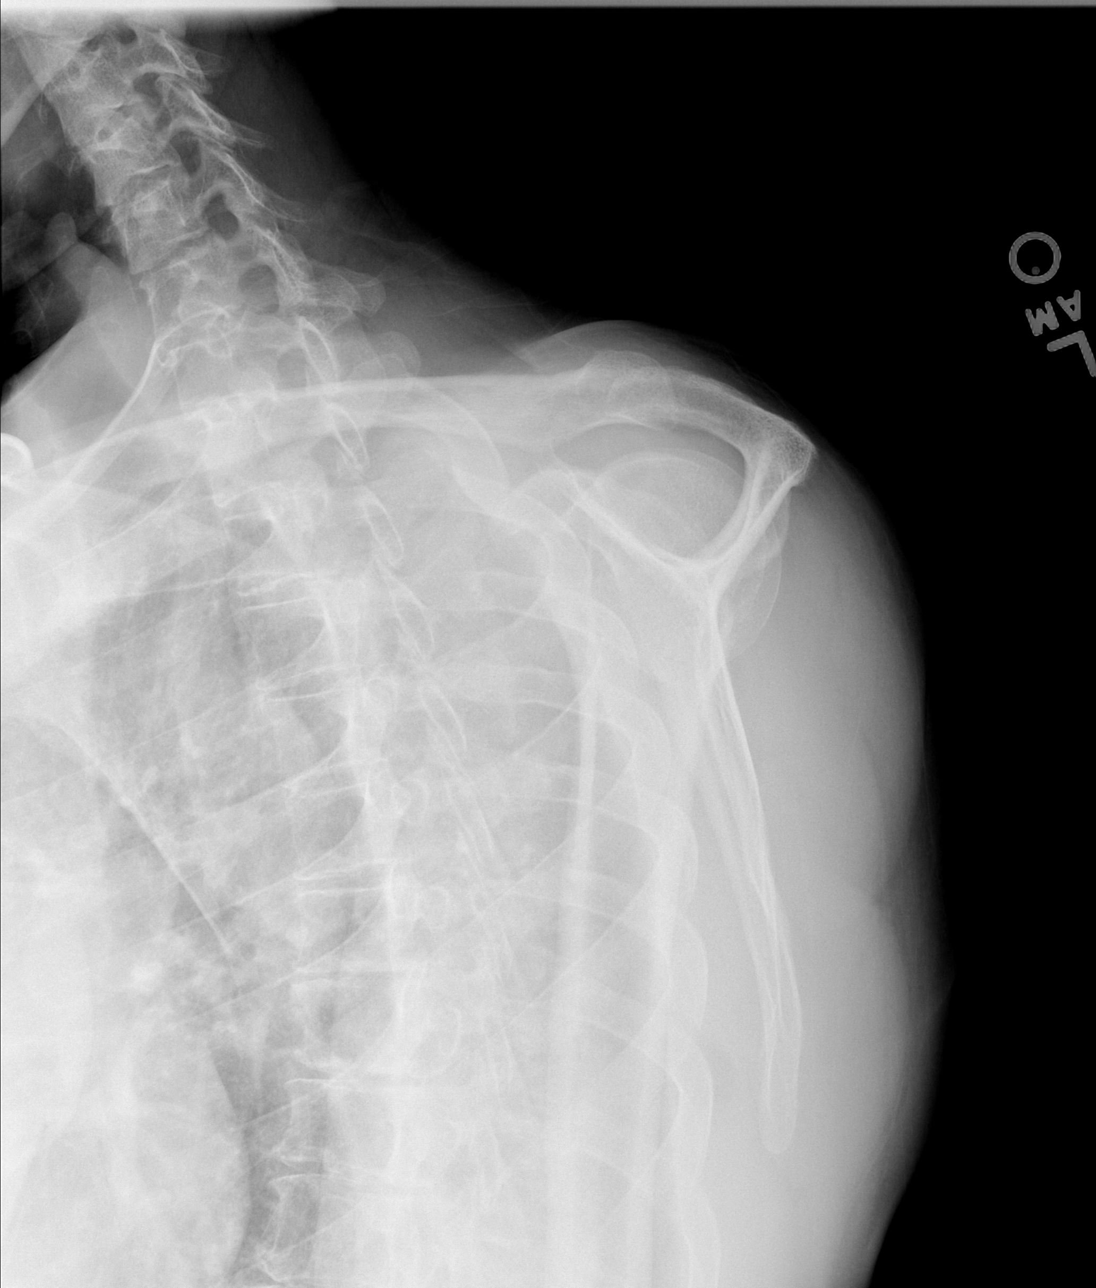

[x shoulder axillary left]
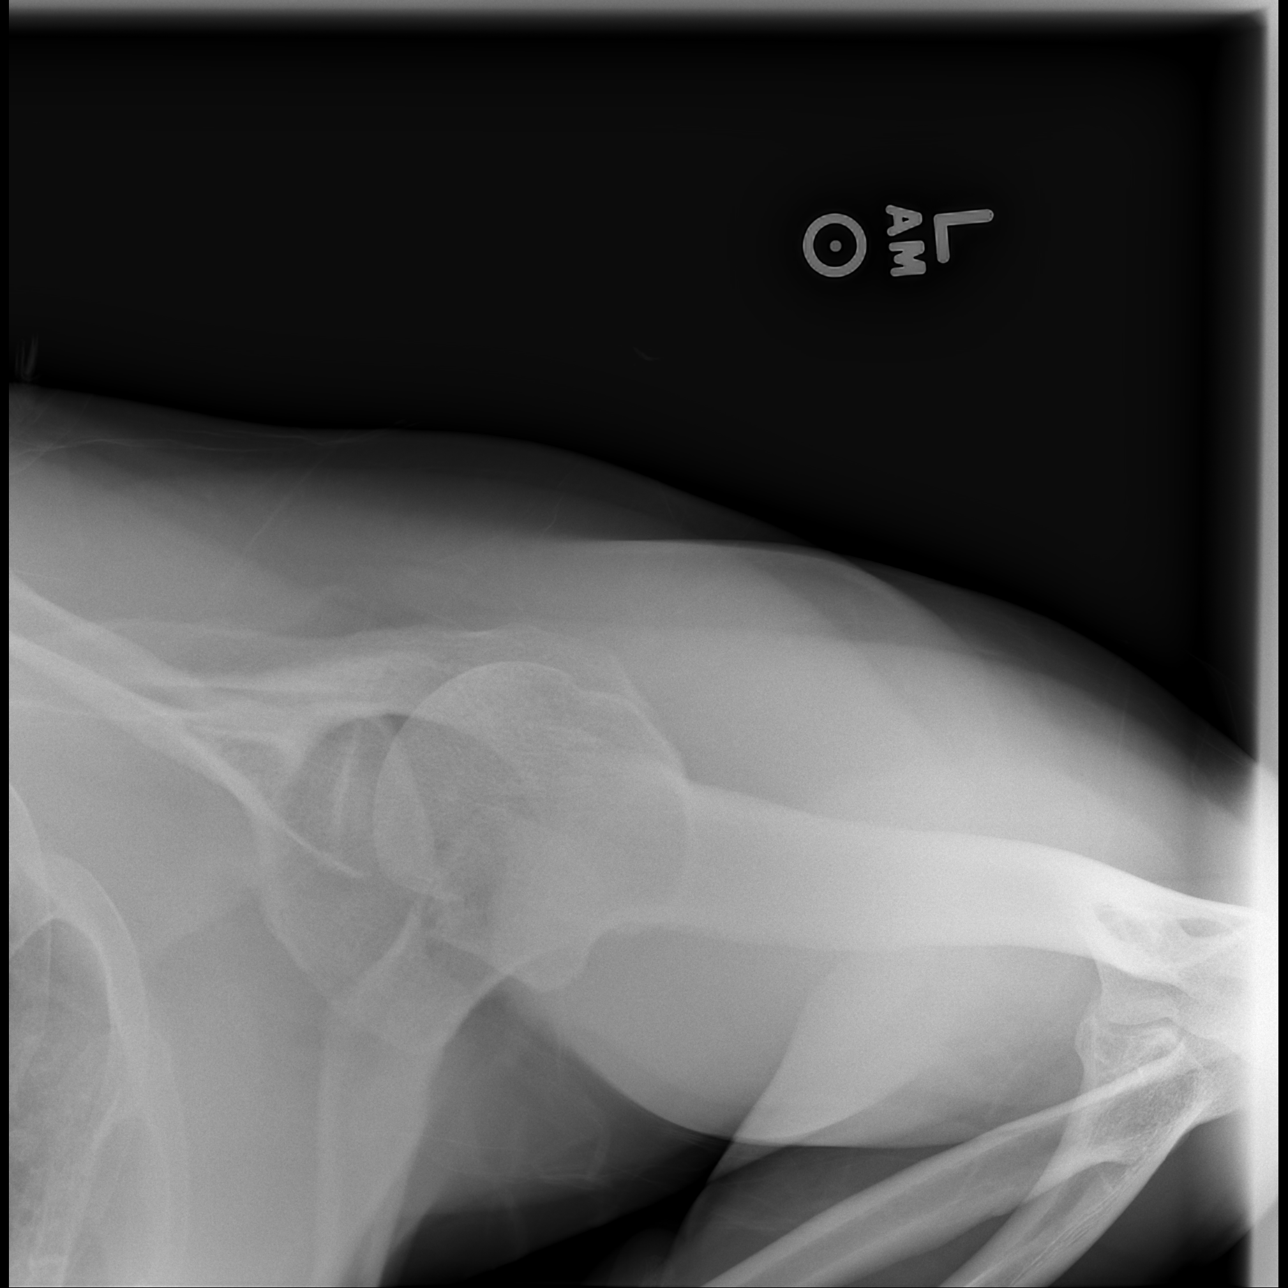

[3 of 3 positions shown; findings below may reference images not displayed]

FINDINGS: There is no evidence of fracture or dislocation. The left humeral
head is seated within the glenoid fossa. The acromioclavicular joint
is unremarkable in appearance. No significant soft tissue
abnormalities are seen. The visualized portions of the left lung are
clear.
IMPRESSION: No evidence of fracture or dislocation.

## 2022-11-30 ENCOUNTER — Other Ambulatory Visit: Payer: Self-pay | Admitting: Physician Assistant

## 2022-11-30 DIAGNOSIS — D487 Neoplasm of uncertain behavior of other specified sites: Secondary | ICD-10-CM

## 2022-12-31 ENCOUNTER — Inpatient Hospital Stay: Admission: RE | Admit: 2022-12-31 | Payer: Medicaid Other | Source: Ambulatory Visit

## 2023-02-24 ENCOUNTER — Other Ambulatory Visit: Payer: Self-pay | Admitting: Physician Assistant

## 2023-02-24 DIAGNOSIS — H9202 Otalgia, left ear: Secondary | ICD-10-CM

## 2023-02-24 DIAGNOSIS — M542 Cervicalgia: Secondary | ICD-10-CM

## 2023-03-15 ENCOUNTER — Ambulatory Visit
Admission: RE | Admit: 2023-03-15 | Discharge: 2023-03-15 | Disposition: A | Discharge status: Home / Self Care | Payer: Commercial Managed Care - PPO | Source: Ambulatory Visit | Attending: Physician Assistant | Admitting: Physician Assistant

## 2023-03-15 DIAGNOSIS — H9202 Otalgia, left ear: Secondary | ICD-10-CM

## 2023-03-15 DIAGNOSIS — M542 Cervicalgia: Secondary | ICD-10-CM

## 2023-03-15 MED ORDER — IOPAMIDOL (ISOVUE-300) INJECTION 61%
75.0000 mL | Freq: Once | INTRAVENOUS | Status: AC | PRN
  Administered 2023-03-15: 75 mL via INTRAVENOUS
